# Patient Record
Sex: Male | Born: 1965 | Race: Black or African American | Hispanic: No | Marital: Married | Smoking: Current every day smoker
Health system: Southern US, Community
[De-identification: ages and names within clinical notes are randomized; demographics above are authoritative.]

## PROBLEM LIST (undated history)

## (undated) HISTORY — PX: NO PAST SURGERIES: SHX2092

---

## 2014-05-01 ENCOUNTER — Ambulatory Visit: Payer: Self-pay | Admitting: Unknown Physician Specialty

## 2014-05-11 ENCOUNTER — Other Ambulatory Visit (HOSPITAL_COMMUNITY): Payer: Self-pay | Admitting: Neurosurgery

## 2014-06-01 ENCOUNTER — Inpatient Hospital Stay (HOSPITAL_COMMUNITY): Admission: RE | Admit: 2014-06-01 | Payer: Self-pay | Source: Ambulatory Visit

## 2014-06-09 ENCOUNTER — Inpatient Hospital Stay (HOSPITAL_COMMUNITY)
Admission: RE | Admit: 2014-06-09 | Payer: Managed Care, Other (non HMO) | Source: Ambulatory Visit | Admitting: Neurosurgery

## 2014-06-09 ENCOUNTER — Encounter (HOSPITAL_COMMUNITY): Admission: RE | Payer: Self-pay | Source: Ambulatory Visit

## 2014-06-09 SURGERY — ANTERIOR CERVICAL CORPECTOMY
Anesthesia: General

## 2015-06-11 IMAGING — CR ORBITS FOR FOREIGN BODY - 2 VIEW
2 series · 2 of 2 positions shown · non-contrast
Comparison: None.

CLINICAL DATA: Metal working/exposure; clearance prior to MRI

EXAM:
ORBITS FOR FOREIGN BODY - 2 VIEW

[orbits waters (1 of 2)]
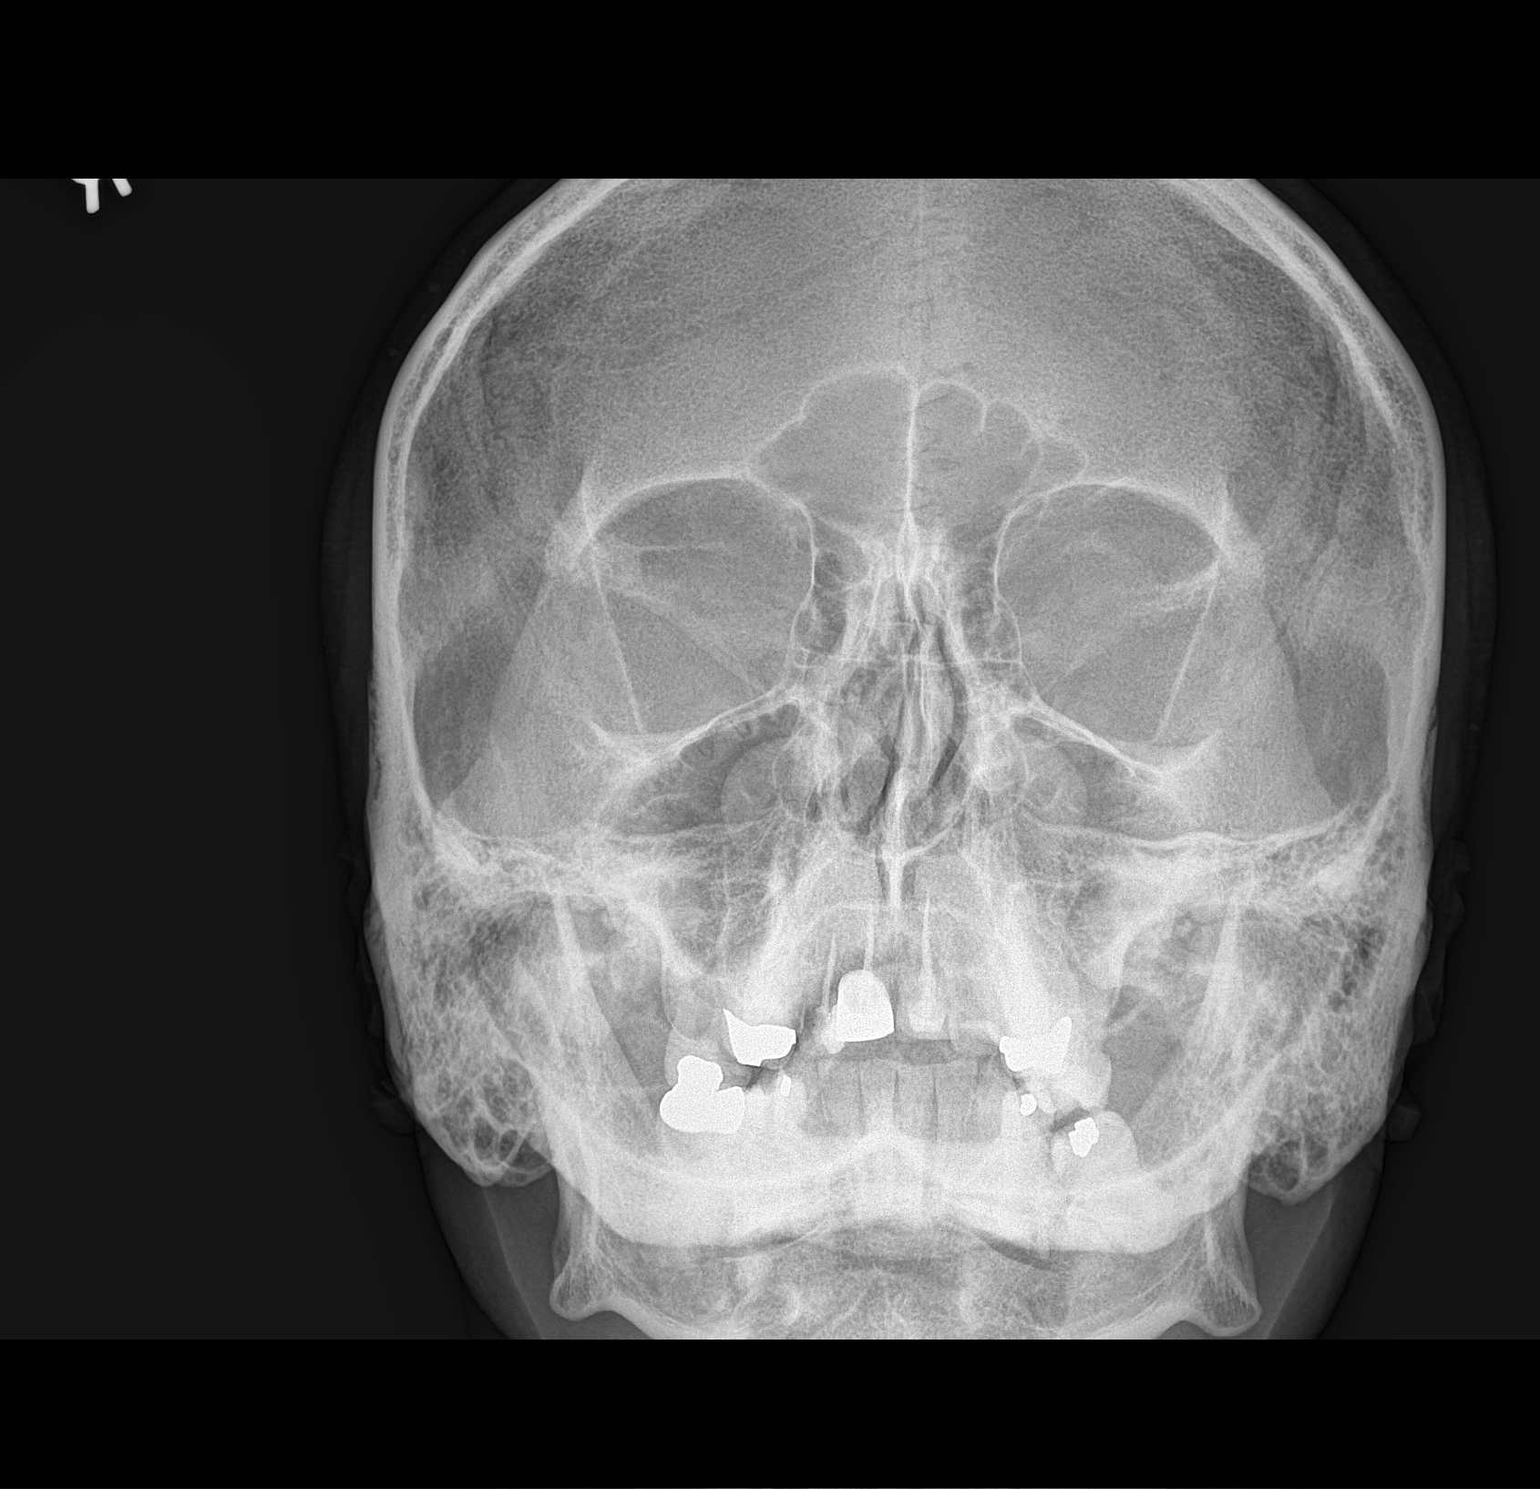

[orbits waters (2 of 2)]
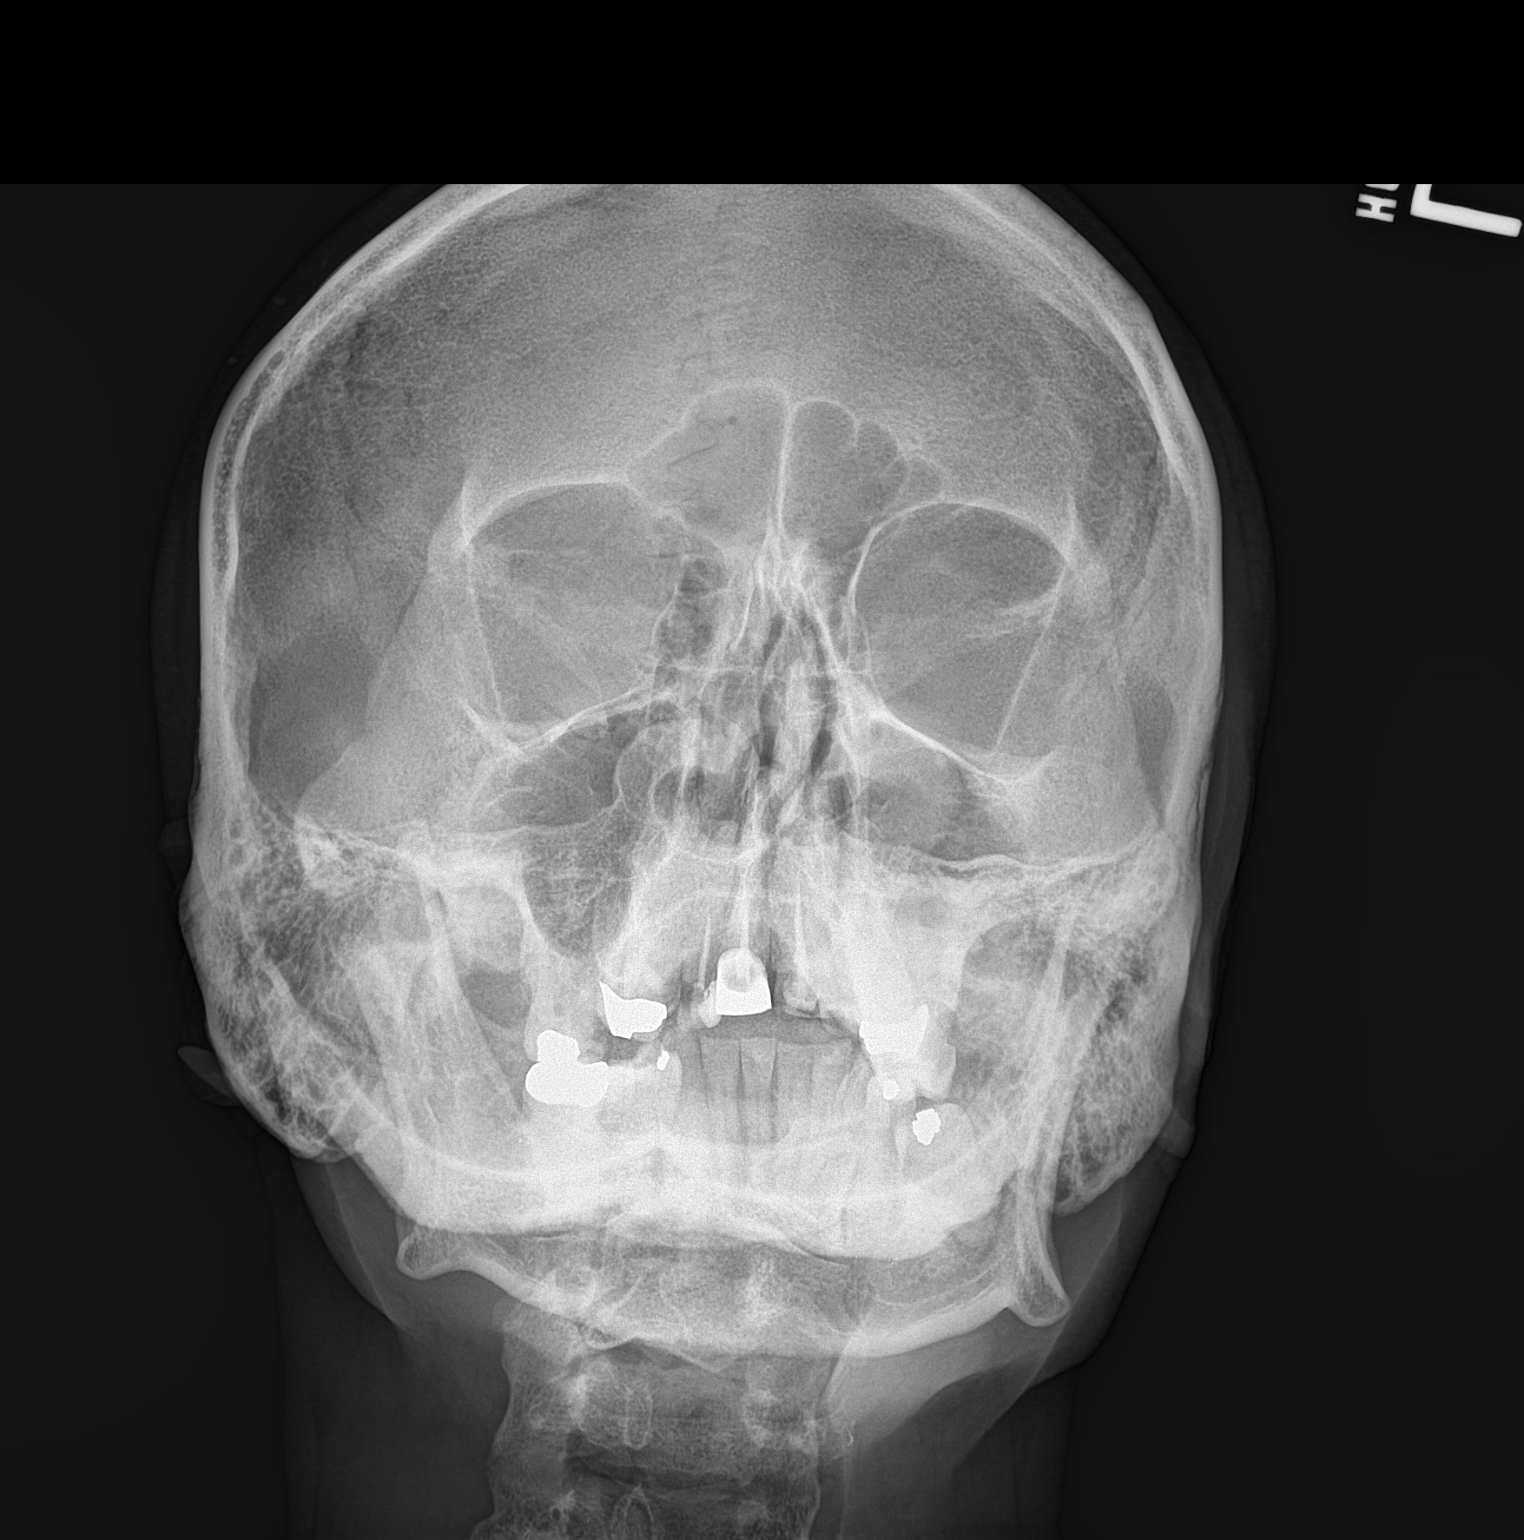

[2 of 2 positions shown; findings below may reference images not displayed]

FINDINGS: There is no evidence of metallic foreign body within the orbits. No
significant bone abnormality identified.
IMPRESSION: No evidence of metallic foreign body within the orbits.

## 2018-12-29 ENCOUNTER — Ambulatory Visit
Admission: EM | Admit: 2018-12-29 | Discharge: 2018-12-29 | Disposition: A | Discharge status: Home / Self Care | Payer: BC Managed Care – PPO

## 2018-12-29 ENCOUNTER — Other Ambulatory Visit: Payer: Self-pay

## 2018-12-29 DIAGNOSIS — T148XXA Other injury of unspecified body region, initial encounter: Secondary | ICD-10-CM

## 2018-12-29 MED ORDER — MUPIROCIN 2 % EX OINT: 1.0000 "application " | TOPICAL_OINTMENT | Freq: Two times a day (BID) | CUTANEOUS | 0 refills | Status: AC
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
## 2018-12-29 NOTE — Discharge Instructions (Signed)
Keep clean.  Antibiotic as prescribed.  Take care  Dr. Sharnise Blough
